# Patient Record
Sex: Male | Born: 1960 | Race: White | Hispanic: No | Marital: Married | Smoking: Never smoker
Health system: Southern US, Community
[De-identification: ages and names within clinical notes are randomized; demographics above are authoritative.]

---

## 1999-02-05 ENCOUNTER — Other Ambulatory Visit: Admission: RE | Admit: 1999-02-05 | Discharge: 1999-02-05 | Payer: Self-pay | Admitting: Otolaryngology

## 1999-08-27 ENCOUNTER — Ambulatory Visit: Admission: RE | Admit: 1999-08-27 | Discharge: 1999-08-27 | Payer: Self-pay | Admitting: Otolaryngology

## 2001-10-18 ENCOUNTER — Emergency Department (HOSPITAL_COMMUNITY): Admission: EM | Admit: 2001-10-18 | Discharge: 2001-10-18 | Payer: Self-pay

## 2001-10-18 ENCOUNTER — Encounter: Payer: Self-pay | Admitting: Emergency Medicine

## 2002-01-22 ENCOUNTER — Emergency Department (HOSPITAL_COMMUNITY): Admission: EM | Admit: 2002-01-22 | Discharge: 2002-01-22 | Payer: Self-pay

## 2002-02-12 ENCOUNTER — Encounter: Payer: Self-pay | Admitting: Gastroenterology

## 2002-02-12 ENCOUNTER — Ambulatory Visit (HOSPITAL_COMMUNITY): Admission: RE | Admit: 2002-02-12 | Discharge: 2002-02-12 | Payer: Self-pay | Admitting: Gastroenterology

## 2005-02-14 ENCOUNTER — Ambulatory Visit (HOSPITAL_COMMUNITY): Admission: RE | Admit: 2005-02-14 | Discharge: 2005-02-14 | Payer: Self-pay | Admitting: Orthopedic Surgery

## 2005-02-14 ENCOUNTER — Ambulatory Visit (HOSPITAL_BASED_OUTPATIENT_CLINIC_OR_DEPARTMENT_OTHER): Admission: RE | Admit: 2005-02-14 | Discharge: 2005-02-14 | Payer: Self-pay | Admitting: Orthopedic Surgery

## 2005-04-21 ENCOUNTER — Encounter: Admission: RE | Admit: 2005-04-21 | Discharge: 2005-06-15 | Payer: Self-pay | Admitting: Orthopedic Surgery

## 2015-09-17 ENCOUNTER — Other Ambulatory Visit: Payer: Self-pay | Admitting: Orthopedic Surgery

## 2015-09-17 DIAGNOSIS — M542 Cervicalgia: Secondary | ICD-10-CM

## 2015-09-26 ENCOUNTER — Ambulatory Visit
Admission: RE | Admit: 2015-09-26 | Discharge: 2015-09-26 | Disposition: A | Discharge status: Home / Self Care | Payer: Self-pay | Source: Ambulatory Visit | Attending: Orthopedic Surgery | Admitting: Orthopedic Surgery

## 2015-09-26 DIAGNOSIS — M542 Cervicalgia: Secondary | ICD-10-CM

## 2016-12-18 IMAGING — MR MR CERVICAL SPINE W/O CM
4 of 5 series · 27 of 48 positions shown · non-contrast
Comparison: None.

CLINICAL DATA: Initial evaluation for several month history of neck
pain radiating into right arm.

EXAM:
MRI CERVICAL SPINE WITHOUT CONTRAST
TECHNIQUE: Multiplanar, multisequence MR imaging of the cervical spine was
performed. No intravenous contrast was administered.

[Series 5: T1 · sagittal · 3.0mm · 0.66mm/px · 6 of 15 slices shown]
[im 1/15]
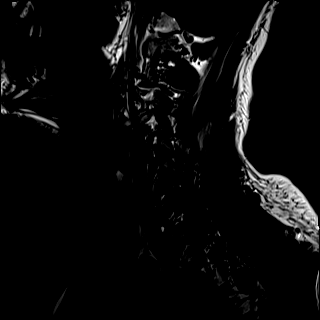
[im 3/15]
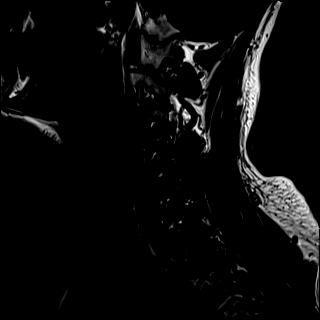
[im 6/15]
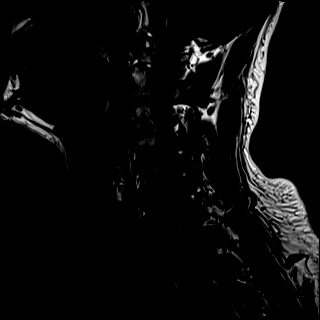
[im 9/15]
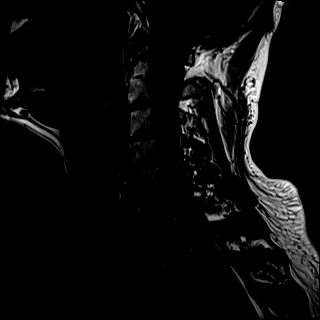
[im 12/15]
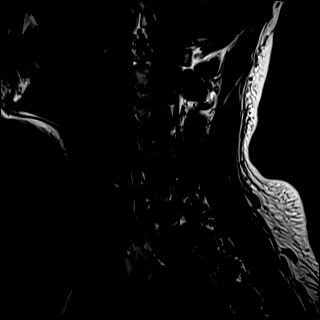
[im 15/15]
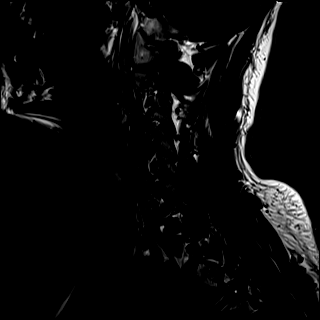

[Series 6: T2 · sagittal · 3.0mm · 0.55mm/px · 6 of 15 slices shown (1 of 2)]
[im 1/15]
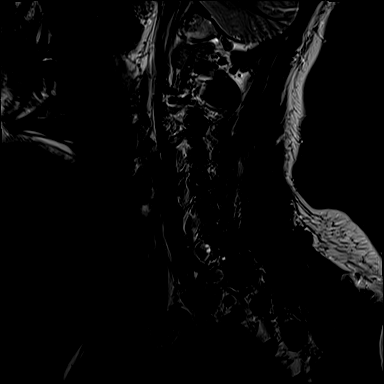
[im 3/15]
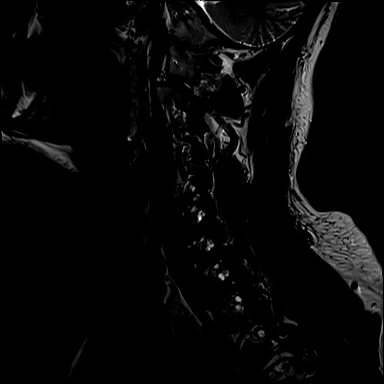
[im 6/15]
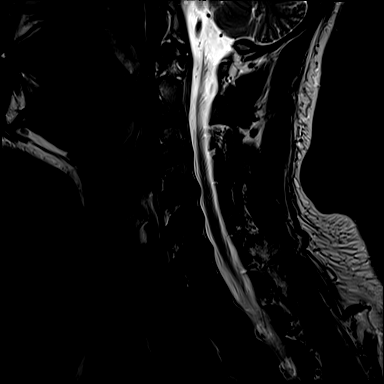
[im 9/15]
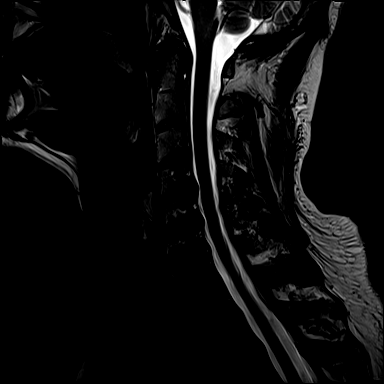
[im 12/15]
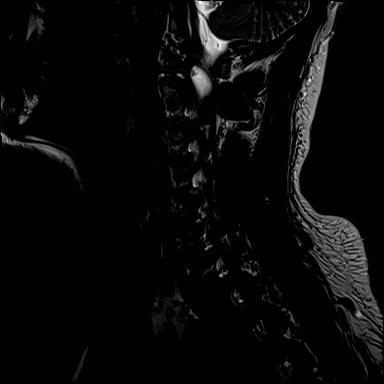
[im 15/15]
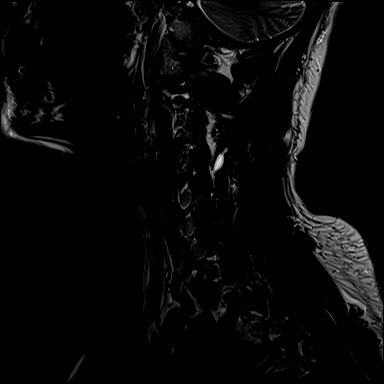

[Series 7: STIR · sagittal · 3.0mm · 0.33mm/px · 6 of 15 slices shown]
[im 1/15]
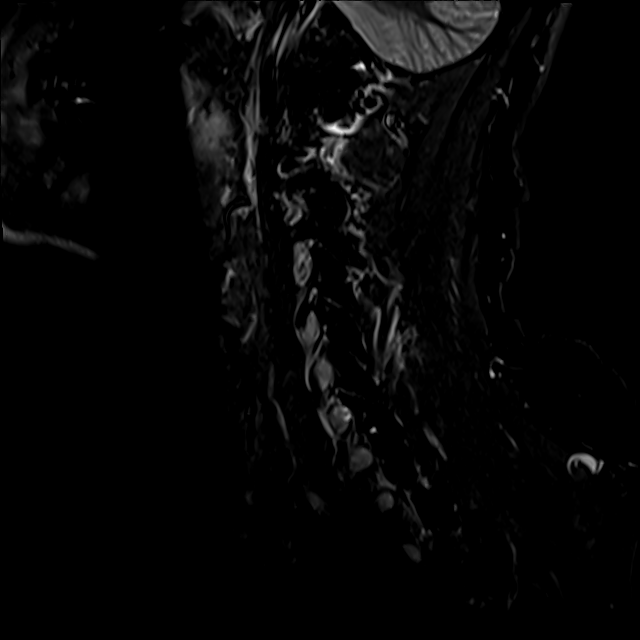
[im 3/15]
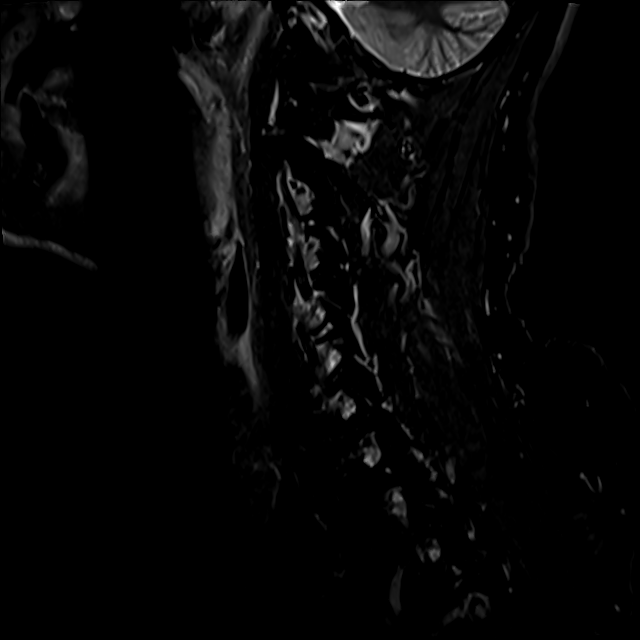
[im 6/15]
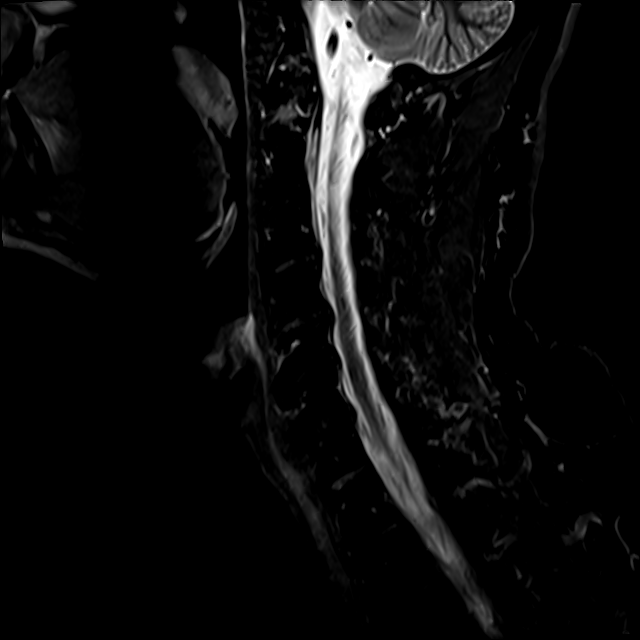
[im 9/15]
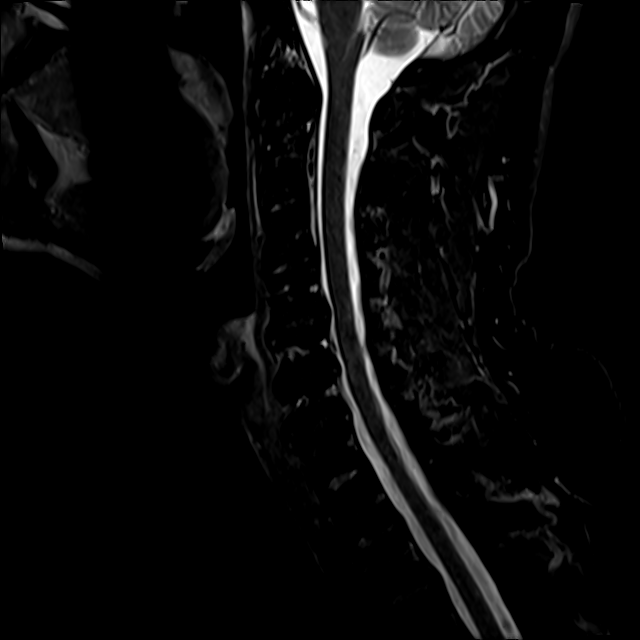
[im 12/15]
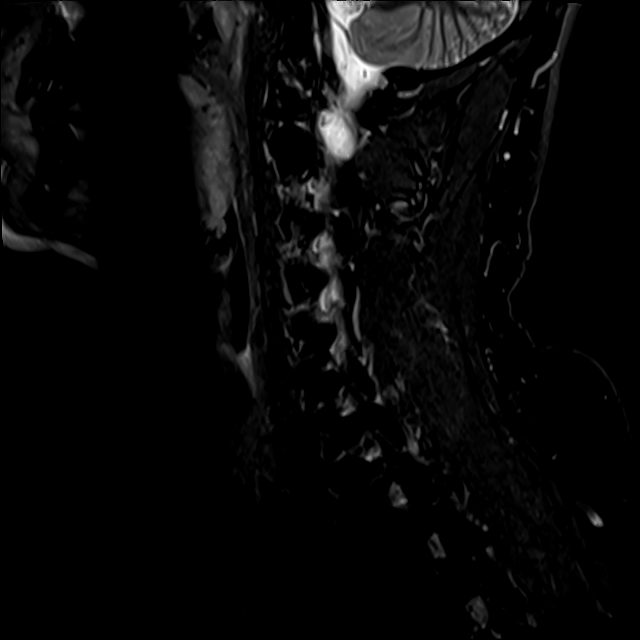
[im 15/15]
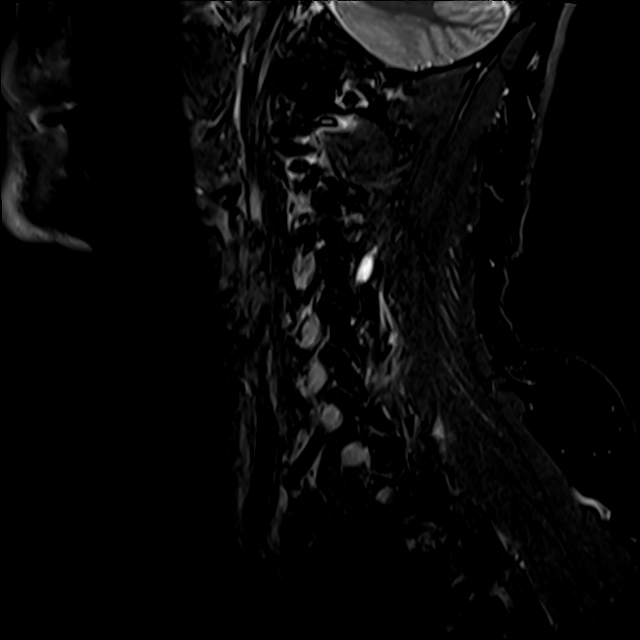

[Series 8: T2 · axial · 3.0mm · 0.50mm/px · z∈[-10,+111]mm · 9 of 38 slices shown (2 of 2)]
[im 1/38]
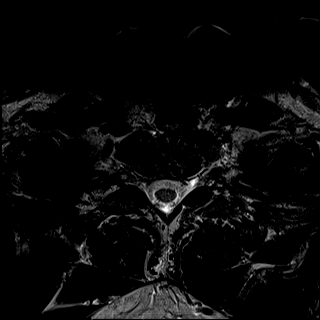
[im 6/38]
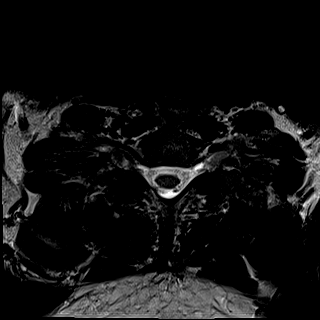
[im 11/38]
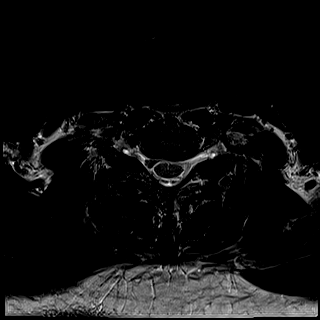
[im 16/38]
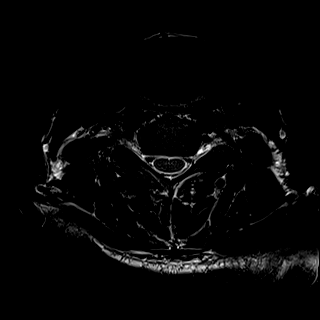
[im 19/38]
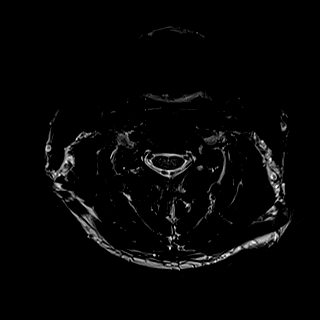
[im 22/38]
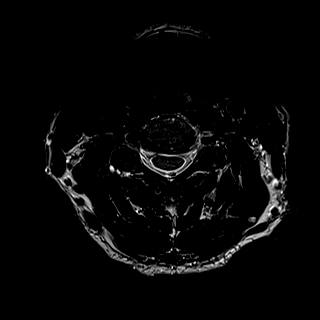
[im 27/38]
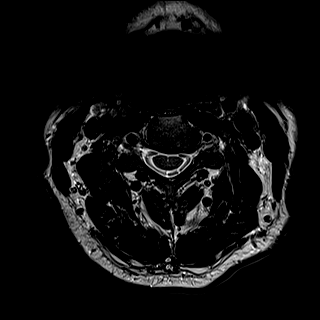
[im 32/38]
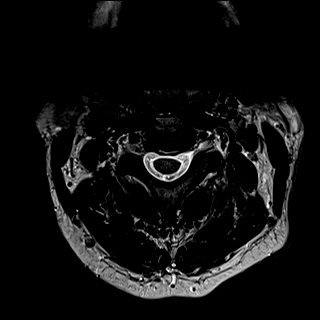
[im 38/38]
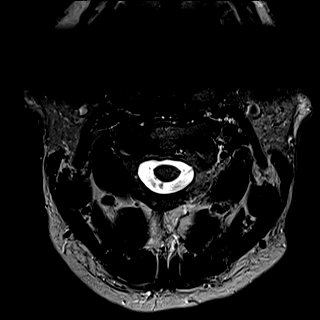

[27 of 48 positions shown; findings below may reference images not displayed]

FINDINGS: Visualized portions of the brain and posterior fossa demonstrate a
normal appearance with normal signal intensity. Craniocervical
junction widely patent.

Vertebral bodies are normally aligned with preservation of the
normal cervical lordosis. Vertebral body heights well maintained. No
listhesis. Signal intensity within the vertebral body bone marrow is
normal. No focal osseous lesion. No marrow edema.

Signal intensity within the cervical spinal cord is normal.

Paraspinous soft tissues are normal. Normal intravascular flow voids
within the vertebral arteries bilaterally.

C2-C3: Mild left-sided uncovertebral hypertrophy. Resultant minimal
if any left foraminal narrowing. Central canal and right neural
foramen widely patent.

C3-C4: Left-sided uncovertebral hypertrophy. Resultant mild to
moderate left foraminal stenosis. Right neural foramen and central
canal are widely patent.

C4-C5: Diffuse annular disc bulge with bilateral uncovertebral
spurring. Posterior disc bulging mildly effaces and flattens the
ventral thecal sac without significant canal stenosis. There is a
more focal tiny left paracentral osteophyte which indents the
ventral thecal sac as well. No significant foraminal narrowing.

C5-C6: Diffuse annular disc bulge with bilateral uncovertebral
hypertrophy. Left-sided facet arthrosis. There is mild bilateral
foraminal narrowing, slightly worse on the left. Bulging disc mildly
indents and flattens the ventral thecal sac without significant
canal stenosis.

C6-C7: Diffuse disc bulge with mild bilateral uncovertebral
hypertrophy. Mild effacement and flattening of the ventral thecal
sac without significant canal stenosis. Foramina remain widely
patent.

C7-T1:  Negative.

Mild disc bulging noted at T2-3 without significant stenosis.
Remainder the visualized upper thoracic spine are unremarkable.
IMPRESSION: 1. Mild degenerative spondylolysis at C4-5, C5-6, and C6-7 without
significant stenosis. No findings to explain right upper extremity
radicular symptoms.
2. Mild to moderate left-sided foraminal narrowing at C3-4 related
to uncovertebral hypertrophy.

## 2017-03-30 ENCOUNTER — Ambulatory Visit (INDEPENDENT_AMBULATORY_CARE_PROVIDER_SITE_OTHER): Payer: PRIVATE HEALTH INSURANCE | Admitting: Orthopedic Surgery

## 2017-03-30 ENCOUNTER — Encounter (INDEPENDENT_AMBULATORY_CARE_PROVIDER_SITE_OTHER): Payer: Self-pay | Admitting: Orthopedic Surgery

## 2017-03-30 DIAGNOSIS — G5601 Carpal tunnel syndrome, right upper limb: Secondary | ICD-10-CM

## 2017-03-30 NOTE — Progress Notes (Signed)
   Office Visit Note   Patient: Russell Patterson           Date of Birth: 08-Nov-1961           MRN: [id number] Visit Date: 03/30/2017 Requested by: No referring provider defined for this encounter. PCP: Nolene Ebbs, MD  Subjective: Chief Complaint  Patient presents with  . 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  He's had left wrist fracture fixation left carpal tunnel release done years ago.  He describes similar symptoms in the right hand.  Particularly when he goes to sleep hand will go numb immediately in this is primarily palmar symptoms.  He is taking Neurontin and Mavik 4 what sounds like neuropathy affecting his feet.  He has tried a wrist splint without relief.  It is hard for him to sleep because of the numbness and tingling              ROS: All systems reviewed are negative as they relate to the chief complaint within the history of present illness.  Patient denies  fevers or chills.   Assessment & Plan: Visit Diagnoses: No diagnosis found.  Plan: Impression is right carpal tunnel syndrome.  Plan is ultrasound-guided injection 1/3 mL Depo-Medrol 1 mL of Marcaine into the carpal tunnel.  Went just ulnar to the palmaris longus tendon.  We'll see how he does with that injection.  May need to consider nerve study and carpal tunnel release if his symptoms don't improve.  I don't think this is radicular from the neck because of the absence of any dorsal hand numbness or arm symptoms.  Follow-Up Instructions: No Follow-up on file.   Orders:  No orders of the defined types were placed in this encounter.  No orders of the defined types were placed in this encounter.     Procedures: No procedures performed   Clinical Data: No additional findings.  Objective: Vital Signs: There were no vitals taken for this visit.  Physical Exam:   Constitutional: Patient appears  well-developed HEENT:  Head: Normocephalic Eyes:EOM are normal Neck: Normal range of motion Cardiovascular: Normal rate Pulmonary/chest: Effort normal Neurologic: Patient is alert Skin: Skin is warm Psychiatric: Patient has normal mood and affect    Ortho Exam: Examination of the right hand demonstrates a little bit of flattening of the abductor pollicis brevis right versus left.  Patient has some arthritis in the PIP joints of digits 2 and 3.  Grip EPL FPL interosseous function is intact.  Wrist flexion extension strength is intact.  Negative Tinel's cubital tunnel at the elbow.  Specialty Comments:  No specialty comments available.  Imaging: No results found.   PMFS History: There are no active problems to display for this patient.  No past medical history on file.  No family history on file.  No past surgical history on file. Social History   Occupational History  . Not on file.   Social History Main Topics  . Smoking status: Never Smoker  . Smokeless tobacco: Never Used  . Alcohol use Not on file  . Drug use: Unknown  . Sexual activity: Not on file

## 2017-04-26 ENCOUNTER — Ambulatory Visit (INDEPENDENT_AMBULATORY_CARE_PROVIDER_SITE_OTHER): Payer: PRIVATE HEALTH INSURANCE | Admitting: Orthopedic Surgery

## 2017-04-26 ENCOUNTER — Ambulatory Visit (INDEPENDENT_AMBULATORY_CARE_PROVIDER_SITE_OTHER): Payer: PRIVATE HEALTH INSURANCE

## 2017-04-26 ENCOUNTER — Encounter (INDEPENDENT_AMBULATORY_CARE_PROVIDER_SITE_OTHER): Payer: Self-pay | Admitting: Orthopedic Surgery

## 2017-04-26 DIAGNOSIS — M25561 Pain in right knee: Secondary | ICD-10-CM

## 2017-04-29 DIAGNOSIS — M25561 Pain in right knee: Secondary | ICD-10-CM

## 2017-04-29 MED ORDER — BUPIVACAINE HCL 0.25 % IJ SOLN
4.0000 mL | INTRAMUSCULAR | Status: AC | PRN
  Administered 2017-04-29: 4 mL via INTRA_ARTICULAR

## 2017-04-29 MED ORDER — LIDOCAINE HCL 1 % IJ SOLN
5.0000 mL | INTRAMUSCULAR | Status: AC | PRN
  Administered 2017-04-29: 5 mL

## 2017-04-29 MED ORDER — METHYLPREDNISOLONE ACETATE 40 MG/ML IJ SUSP
40.0000 mg | INTRAMUSCULAR | Status: AC | PRN
  Administered 2017-04-29: 40 mg via INTRA_ARTICULAR

## 2017-04-29 NOTE — Progress Notes (Signed)
Office Visit Note   Patient: Russell Patterson           Date of Birth: 1961-05-19           MRN: [id number] Visit Date: 04/26/2017 Requested by: Nolene Ebbs, MD [address] [place], [place] [address] PCP: Nolene Ebbs, MD  Subjective: Chief Complaint  Patient presents with  . Right Knee - Pain    HPI: Russell Patterson is a patient with right knee pain.  Denies history of injury.  Lastly daisies notice discomfort in that his knee was feeling full.  Last night started feeling stabbing pain in the knee and was very painful to weight-bear.  States that his knee is locked up.  Lateral movement increases his pain.  Had previous injection 2 years ago 04/08/2015.  Works as a Games developer.  States his knee is stiff when he gets out of the [place].  Surface demonstrated at times.  He reports a knifelike pain in the retropatellar region of the right knee.  He does take medication Neurontin for the problem.  He did have a right wrist injection which helped him some.  His injuries and problems started in the right knee in 1996 when he was trying to demonstrate to his son skateboarding techniques.              ROS: All systems reviewed are negative as they relate to the chief complaint within the history of present illness.  Patient denies  fevers or chills.   Assessment & Plan: Visit Diagnoses:  1. Right knee pain, unspecified chronicity   2. Acute pain of right knee     Plan: Impression is right knee pain with mild effusion possible meniscal pathology with mild degenerative changes noted on radiographs.  We'll try an injection into the right knee.  If this doesn't work then really his decision will be whether or not he wants to get an MRI scan to evaluate if there is arthroscopically treatable pathology in the knee.  I'll see him back as needed  Follow-Up Instructions: Return if symptoms worsen or fail to improve.   Orders:  Orders Placed This Encounter  Procedures  . XR KNEE 3 VIEW RIGHT   No orders  of the defined types were placed in this encounter.     Procedures: Large Joint Inj Date/Time: 04/29/2017 3:08 PM Performed by: Meredith Pel Authorized by: Meredith Pel   Consent Given by:  Patient Site marked: the procedure site was marked   Timeout: prior to procedure the correct patient, procedure, and site was verified   Indications:  Pain, joint swelling and diagnostic evaluation Location:  Knee Site:  R knee Prep: patient was prepped and draped in usual sterile fashion   Needle Size:  18 G Needle Length:  1.5 inches Approach:  Superolateral Ultrasound Guidance: No   Fluoroscopic Guidance: No   Arthrogram: No   Medications:  5 mL lidocaine 1 %; 4 mL bupivacaine 0.25 %; 40 mg methylPREDNISolone acetate 40 MG/ML Aspiration Attempted: Yes   Aspirate amount (mL):  15 Aspirate:  Yellow Patient tolerance:  Patient tolerated the procedure well with no immediate complications     Clinical Data: No additional findings.  Objective: Vital Signs: There were no vitals taken for this visit.  Physical Exam:   Constitutional: Patient appears well-developed HEENT:  Head: Normocephalic Eyes:EOM are normal Neck: Normal range of motion Cardiovascular: Normal rate Pulmonary/chest: Effort normal Neurologic: Patient is alert Skin: Skin is warm Psychiatric: Patient has normal mood and  affect    Ortho Exam: Right knee exam demonstrates full active and passive range of motion mild effusion mild medial and lateral joint line tenderness with patella femoral crepitus palpable pedal pulses intact extensor mechanism no groin pain with internal/external rotation of the knee or leg palpable pedal pulses stable collateral and cruciate ligaments  Specialty Comments:  No specialty comments available.  Imaging: No results found.   PMFS History: There are no active problems to display for this patient.  No past medical history on file.  No family history on file.  No  past surgical history on file. Social History   Occupational History  . Not on file.   Social History Main Topics  . Smoking status: Never Smoker  . Smokeless tobacco: Never Used  . Alcohol use Not on file  . Drug use: Unknown  . Sexual activity: Not on file

## 2017-07-20 ENCOUNTER — Ambulatory Visit (INDEPENDENT_AMBULATORY_CARE_PROVIDER_SITE_OTHER): Payer: PRIVATE HEALTH INSURANCE | Admitting: Orthopedic Surgery

## 2017-07-20 ENCOUNTER — Encounter (INDEPENDENT_AMBULATORY_CARE_PROVIDER_SITE_OTHER): Payer: Self-pay | Admitting: Orthopedic Surgery

## 2017-07-20 DIAGNOSIS — G8929 Other chronic pain: Secondary | ICD-10-CM

## 2017-07-20 DIAGNOSIS — M75101 Unspecified rotator cuff tear or rupture of right shoulder, not specified as traumatic: Secondary | ICD-10-CM

## 2017-07-20 DIAGNOSIS — M25511 Pain in right shoulder: Secondary | ICD-10-CM | POA: Diagnosis not present

## 2017-07-20 MED ORDER — LIDOCAINE HCL 1 % IJ SOLN
5.0000 mL | INTRAMUSCULAR | Status: AC | PRN
  Administered 2017-07-20: 5 mL

## 2017-07-20 MED ORDER — METHYLPREDNISOLONE ACETATE 40 MG/ML IJ SUSP
40.0000 mg | INTRAMUSCULAR | Status: AC | PRN
  Administered 2017-07-20: 40 mg via INTRA_ARTICULAR

## 2017-07-20 MED ORDER — BUPIVACAINE HCL 0.5 % IJ SOLN
9.0000 mL | INTRAMUSCULAR | Status: AC | PRN
  Administered 2017-07-20: 9 mL via INTRA_ARTICULAR

## 2017-07-20 NOTE — Addendum Note (Signed)
Addended byLaurann Montana on: 07/20/2017 10:31 AM   Modules accepted: Orders

## 2017-07-20 NOTE — Progress Notes (Signed)
Office Visit Note   Patient: Russell Patterson           Date of Birth: 11/27/1960           MRN: [id number] Visit Date: 07/20/2017 Requested by: Nolene Ebbs, MD [address] [place], [place] [address] PCP: Nolene Ebbs, MD  Subjective: Chief Complaint  Patient presents with  . Right Shoulder - Pain  . Lower Back - Pain    HPI: Russell Patterson is a 56 year old patient who comes in with reports of right shoulder pain and bilateral buttock pain.  The right shoulder has been hurting him since the summer.  He rates that pain as 9 out of level X.  He describes doing some cranking workover the summer at which time he felt a pop in his shoulder.  He's had fairly significant pain and weakness  Since that time.  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  Patient also describes some low back pain which radiates into the buttocks.  He doesn't describe any discrete radiation into the legs.  It's moved from the right side to the left side.  He's starting to feel at some when he is walking.  Does hurt him when he does prolonged sitting more than 20 minutes.  Denies any other bowel and bladder symptoms.              ROS: All systems reviewed are negative as they relate to the chief complaint within the history of present illness.  Patient denies  fevers or chills.   Assessment & Plan: Visit Diagnoses: No diagnosis found.  Plan: impression is low back pain likely facet arthritis mediated.  He states he can live with this.  Talked with him about the workup for that which would be x-rays MRI scans and then injection.  He would much prefer to have the workup done on his shoulder.  In this regard ultrasound examination is performed today.it suggests at least upper portion of subscapularis tear.  Biceps tendon not in the groove from chronic tear 10 years ago.  Likely patient has anterior superior rotator cuff pathology.  Plan MRI arthrogram to evaluate.  See him back after that study injection  performed today in the subacromial space for pain relie  Follow-Up Instructions: No Follow-up on file.   Orders:  No orders of the defined types were placed in this encounter.  No orders of the defined types were placed in this encounter.     Procedures: Large Joint Inj Date/Time: 07/20/2017 9:56 AM Performed by: Meredith Pel Authorized by: Meredith Pel   Consent Given by:  Patient Site marked: the procedure site was marked   Timeout: prior to procedure the correct patient, procedure, and site was verified   Indications:  Pain and diagnostic evaluation Location:  Shoulder Site:  R subacromial bursa Prep: patient was prepped and draped in usual sterile fashion   Needle Size:  18 G Needle Length:  1.5 inches Approach:  Posterior Ultrasound Guidance: No   Fluoroscopic Guidance: No   Arthrogram: No   Medications:  5 mL lidocaine 1 %; 9 mL bupivacaine 0.5 %; 40 mg methylPREDNISolone acetate 40 MG/ML Aspiration Attempted: No   Patient tolerance:  Patient tolerated the procedure well with no immediate complications     Clinical Data: No additional findings.  Objective: Vital Signs: There were no vitals taken for this visit.  Physical Exam:   Constitutional: Patient appears well-developed HEENT:  Head: Normocephalic Eyes:EOM are normal Neck:  Normal range of motion Cardiovascular: Normal rate Pulmonary/chest: Effort normal Neurologic: Patient is alert Skin: Skin is warm Psychiatric: Patient has normal mood and affect    Ortho Exam: orthopedic exam demonstrates good cervical spine range of motion does have weakness with abduction and internal rotation on the right shoulder.  There is some coarseness with passive range of motion as well.  No limitation of passive range of motion.  Biceps tendon has ruptured with Popeye deformity noted.  Motor sensory function to the hand is intact.  Most and tingling and significant finding is the weakness to internal  rotation.  Specialty Comments:  No specialty comments available.  Imaging: No results found.   PMFS History: There are no active problems to display for this patient.  No past medical history on file.  No family history on file.  No past surgical history on file. Social History   Occupational History  . Not on file.   Social History Main Topics  . Smoking status: Never Smoker  . Smokeless tobacco: Never Used  . Alcohol use Not on file  . Drug use: Unknown  . Sexual activity: Not on file

## 2017-07-24 ENCOUNTER — Encounter (INDEPENDENT_AMBULATORY_CARE_PROVIDER_SITE_OTHER): Payer: Self-pay | Admitting: *Deleted

## 2018-04-26 ENCOUNTER — Ambulatory Visit (INDEPENDENT_AMBULATORY_CARE_PROVIDER_SITE_OTHER): Payer: PRIVATE HEALTH INSURANCE | Admitting: Orthopedic Surgery

## 2018-04-26 ENCOUNTER — Encounter (INDEPENDENT_AMBULATORY_CARE_PROVIDER_SITE_OTHER): Payer: Self-pay | Admitting: Orthopedic Surgery

## 2018-04-26 DIAGNOSIS — M75122 Complete rotator cuff tear or rupture of left shoulder, not specified as traumatic: Secondary | ICD-10-CM | POA: Diagnosis not present

## 2018-04-26 DIAGNOSIS — M75102 Unspecified rotator cuff tear or rupture of left shoulder, not specified as traumatic: Secondary | ICD-10-CM

## 2018-04-26 NOTE — Progress Notes (Signed)
Office Visit Note   Patient: Russell Patterson           Date of Birth: 1961/03/12           MRN: [id number] Visit Date: 04/26/2018 Requested by: Nolene Ebbs, MD [address] [place],  [address] PCP: Nolene Ebbs, MD  Subjective: Chief Complaint  Patient presents with  . Right Shoulder - Pain  . Left Shoulder - Pain    HPI: Russell Patterson is a patient with bilateral shoulder pain long-standing left worse than right.  Describes aching pain in the left arm which was for 3 or 4 hours last night.  States that the left shoulder has a toothache type pain at night but does well during the day.  Does radiate up into his neck.  Since I have last seen him he has been placed on Eliquis for atrial fibrillation.  He cannot take anti-inflammatories any  more.  He does not report much in the way of weakness in the shoulders.  No recent injury in the shoulders.              ROS: All systems reviewed are negative as they relate to the chief complaint within the history of present illness.  Patient denies  fevers or chills.   Assessment & Plan: Visit Diagnoses:  1. Nontraumatic tear of left rotator cuff, unspecified tear extent     Plan: Radicular component to the left sided pain.  MRI scan reviewed from 2016 of the cervical spine which did show some mild to moderate left-sided compressive pathology.  Looked at his shoulder under ultrasound today as well on the left-hand side and it looks like he does have rotator cuff pathology involving the supraspinatus and infraspinatus.  Subscapularis looked intact.  Plan at this time is for a diagnostic and therapeutic subacromial injection to see if that helps some of this toothache type pain he is having.  If not then I think we should consider injection of the neck after bringing him off Eliquis with consultation from his cardiologist.  He will let me know next week what he wants to do.  [name] is bilateral shoulder pain left worse than right with potential  for  Follow-Up Instructions: Return if symptoms worsen or fail to improve.   Orders:  No orders of the defined types were placed in this encounter.  No orders of the defined types were placed in this encounter.     Procedures: Large Joint Inj: L subacromial bursa on 04/26/2018 10:23 AM Indications: diagnostic evaluation and pain Details: 18 G 1.5 in needle, posterior approach  Arthrogram: No  Medications: 9 mL bupivacaine 0.5 %; 40 mg methylPREDNISolone acetate 40 MG/ML; 5 mL lidocaine 1 % Outcome: tolerated well, no immediate complications Procedure, treatment alternatives, risks and benefits explained, specific risks discussed. Consent was given by the patient. Immediately prior to procedure a time out was called to verify the correct patient, procedure, equipment, support staff and site/side marked as required. Patient was prepped and draped in the usual sterile fashion.       Clinical Data: No additional findings.  Objective: Vital Signs: There were no vitals taken for this visit.  Physical Exam:   Constitutional: Patient appears well-developed HEENT:  Head: Normocephalic Eyes:EOM are normal Neck: Normal range of motion Cardiovascular: Normal rate Pulmonary/chest: Effort normal Neurologic: Patient is alert Skin: Skin is warm Psychiatric: Patient has normal mood and affect    Ortho Exam: Orthopedic exam demonstrates good cervical spine range of motion.  He  has excellent grip EPL FPL interosseous wrist flexion extension bicep triceps and deltoid strength with no paresthesias C5-T1.  Reflexes symmetric bilateral biceps and triceps.  Rotator cuff strength testing is actually excellent infraspinatus supraspinatus and subscap muscle testing in both shoulders.  Actually a little bit stronger on the right-hand side than I remember in the past.  No coarse grinding below shoulder level with passive range of motion but once we get up to 90 degrees of abduction he does have some  coarseness in both shoulders.  This feels more like soft tissue coarseness and not arthritic type coarseness.  Specialty Comments:  No specialty comments available.  Imaging: No results found.   PMFS History: There are no active problems to display for this patient.  History reviewed. No pertinent past medical history.  History reviewed. No pertinent family history.  History reviewed. No pertinent surgical history. Social History   Occupational History  . Not on file  Tobacco Use  . Smoking status: Never Smoker  . Smokeless tobacco: Never Used  Substance and Sexual Activity  . Alcohol use: Not on file  . Drug use: Not on file  . Sexual activity: Not on file

## 2018-04-28 MED ORDER — BUPIVACAINE HCL 0.5 % IJ SOLN
9.0000 mL | INTRAMUSCULAR | Status: AC | PRN
  Administered 2018-04-26: 9 mL via INTRA_ARTICULAR

## 2018-04-28 MED ORDER — LIDOCAINE HCL 1 % IJ SOLN
5.0000 mL | INTRAMUSCULAR | Status: AC | PRN
  Administered 2018-04-26: 5 mL

## 2018-04-28 MED ORDER — METHYLPREDNISOLONE ACETATE 40 MG/ML IJ SUSP
40.0000 mg | INTRAMUSCULAR | Status: AC | PRN
  Administered 2018-04-26: 40 mg via INTRA_ARTICULAR

## 2018-06-22 ENCOUNTER — Other Ambulatory Visit (INDEPENDENT_AMBULATORY_CARE_PROVIDER_SITE_OTHER): Payer: Self-pay

## 2018-06-22 ENCOUNTER — Telehealth (INDEPENDENT_AMBULATORY_CARE_PROVIDER_SITE_OTHER): Payer: Self-pay | Admitting: Orthopedic Surgery

## 2018-06-22 DIAGNOSIS — M542 Cervicalgia: Secondary | ICD-10-CM

## 2018-06-22 NOTE — Telephone Encounter (Signed)
Patients wife called, made an appt for patients neck pain at earliest appt (07/11/18). She is requesting a call back from you when possible # [phone]

## 2018-06-22 NOTE — Telephone Encounter (Signed)
IC patients wife back. She stated patient was having continued severe neck/arm pain but worsening over the last week. Per Dr Forbes Cellar last OV note from June, patient was to consider cervical injection. Will need to get clearance from cardiologist to come off of Eliquis. I put order in for this and they will keep appt scheduled with Dr Marlou Sa for now.

## 2018-06-26 ENCOUNTER — Telehealth (INDEPENDENT_AMBULATORY_CARE_PROVIDER_SITE_OTHER): Payer: Self-pay | Admitting: *Deleted

## 2018-07-11 ENCOUNTER — Ambulatory Visit (INDEPENDENT_AMBULATORY_CARE_PROVIDER_SITE_OTHER): Payer: PRIVATE HEALTH INSURANCE | Admitting: Orthopedic Surgery

## 2018-10-08 ENCOUNTER — Ambulatory Visit (INDEPENDENT_AMBULATORY_CARE_PROVIDER_SITE_OTHER): Payer: PRIVATE HEALTH INSURANCE | Admitting: Orthopedic Surgery

## 2018-10-19 ENCOUNTER — Ambulatory Visit (INDEPENDENT_AMBULATORY_CARE_PROVIDER_SITE_OTHER): Payer: PRIVATE HEALTH INSURANCE | Admitting: Orthopedic Surgery

## 2018-11-19 ENCOUNTER — Ambulatory Visit (INDEPENDENT_AMBULATORY_CARE_PROVIDER_SITE_OTHER): Payer: PRIVATE HEALTH INSURANCE | Admitting: Orthopedic Surgery

## 2018-11-19 ENCOUNTER — Encounter (INDEPENDENT_AMBULATORY_CARE_PROVIDER_SITE_OTHER): Payer: Self-pay | Admitting: Orthopedic Surgery

## 2018-11-19 DIAGNOSIS — M180 Bilateral primary osteoarthritis of first carpometacarpal joints: Secondary | ICD-10-CM

## 2018-11-21 ENCOUNTER — Encounter (INDEPENDENT_AMBULATORY_CARE_PROVIDER_SITE_OTHER): Payer: Self-pay | Admitting: Orthopedic Surgery

## 2018-11-21 DIAGNOSIS — M180 Bilateral primary osteoarthritis of first carpometacarpal joints: Secondary | ICD-10-CM | POA: Diagnosis not present

## 2018-11-21 MED ORDER — BUPIVACAINE HCL 0.25 % IJ SOLN
0.3300 mL | INTRAMUSCULAR | Status: AC | PRN
  Administered 2018-11-21: .33 mL via INTRA_ARTICULAR

## 2018-11-21 MED ORDER — METHYLPREDNISOLONE ACETATE 40 MG/ML IJ SUSP
13.3300 mg | INTRAMUSCULAR | Status: AC | PRN
  Administered 2018-11-21: 13.33 mg via INTRA_ARTICULAR

## 2018-11-21 MED ORDER — LIDOCAINE HCL 1 % IJ SOLN
1.0000 mL | INTRAMUSCULAR | Status: AC | PRN
  Administered 2018-11-21: 1 mL

## 2018-11-21 NOTE — Progress Notes (Signed)
Office Visit Note   Patient: Russell Patterson           Date of Birth: 12/03/1960           MRN: [id number] Visit Date: 11/19/2018 Requested by: Russell Ebbs, MD [address] [place], [place] [address] PCP: Russell Ebbs, MD  Subjective: Chief Complaint  Patient presents with  . Left Thumb - Pain  . 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  Localizes the pain both to the [facility] joint as well as the IP joint of the thumbs.  He cannot take Mobic anymore because of an atrial fibrillation diagnosis.  He is on Eliquis for that.  Describes tingling numbness and burning in that thumb as well as pain with pinching.  Also reports decreased grip strength.  He has had a neck injection which helped him significantly.              ROS: All systems reviewed are negative as they relate to the chief complaint within the history of present illness.  Patient denies  fevers or chills.   Assessment & Plan: Visit Diagnoses:  1. Primary osteoarthritis of both first carpometacarpal joints     Plan: Impression is bilateral CMC arthritis with positive grind test and radiographic evidence of early mild to moderate arthritis in the [facility] joint.  He also has a component of IP thumb arthritis but that is not as severe as his CMC arthritis.  Plan is bilateral fluoroscopically guided injections today.  We will see him back as needed.  He may need to get this done once or twice a year before he elects to have [facility] arthroplasty.  Follow-Up Instructions: Return if symptoms worsen or fail to improve.   Orders:  No orders of the defined types were placed in this encounter.  No orders of the defined types were placed in this encounter.     Procedures: Small Joint Inj: bilateral thumb MCP on 11/21/2018 12:23 PM Indications: pain Details: 25 G needle, fluoroscopy-guided radial approach  Spinal Needle: No  Medications (Right): 1 mL lidocaine 1 %; 0.33  mL bupivacaine 0.25 %; 13.33 mg methylPREDNISolone acetate 40 MG/ML Medications (Left): 1 mL lidocaine 1 %; 0.33 mL bupivacaine 0.25 %; 13.33 mg methylPREDNISolone acetate 40 MG/ML Outcome: tolerated well, no immediate complications Procedure, treatment alternatives, risks and benefits explained, specific risks discussed. Consent was given by the patient. Immediately prior to procedure a time out was called to verify the correct patient, procedure, equipment, support staff and site/side marked as required. Patient was prepped and draped in the usual sterile fashion.       Clinical Data: No additional findings.  Objective: Vital Signs: There were no vitals taken for this visit.  Physical Exam:   Constitutional: Patient appears well-developed HEENT:  Head: Normocephalic Eyes:EOM are normal Neck: Normal range of motion Cardiovascular: Normal rate Pulmonary/chest: Effort normal Neurologic: Patient is alert Skin: Skin is warm Psychiatric: Patient has normal mood and affect    Ortho Exam: Ortho exam demonstrates more restricted range of motion of the IP joint thumb left versus right.  CMC grind test positive bilaterally.  Pinch strength is somewhat diminished more on the right than the left and its more painful on the right compared to the left.  Wrist range of motion otherwise intact.  No muscle atrophy in the hands.  Radial pulse palpable bilaterally.  Specialty Comments:  No specialty comments available.  Imaging: No results found.   PMFS History: There are no active problems to display for this patient.  History reviewed. No pertinent past medical history.  History reviewed. No pertinent family history.  History reviewed. No pertinent surgical history. Social History   Occupational History  . Not on file  Tobacco Use  . Smoking status: Never Smoker  . Smokeless tobacco: Never Used  Substance and Sexual Activity  . Alcohol use: Not on file  . Drug use: Not on file    . Sexual activity: Not on file

## 2019-01-08 ENCOUNTER — Telehealth (INDEPENDENT_AMBULATORY_CARE_PROVIDER_SITE_OTHER): Payer: Self-pay | Admitting: Orthopedic Surgery

## 2019-01-08 MED ORDER — MELOXICAM 15 MG PO TABS: 15.0000 mg | ORAL_TABLET | Freq: Every day | ORAL | 0 refills | Status: DC

## 2019-01-08 NOTE — Telephone Encounter (Signed)
Advised 

## 2019-01-08 NOTE — Telephone Encounter (Signed)
Patient's wife called and Needs to talk about his medication and the shot that was given to him.  [phone]

## 2019-01-08 NOTE — Telephone Encounter (Signed)
IC s/w patients wife.  She stated that patient has not gotten any relief from the thumb injections. Stated he was in agony.  She said that Dr Marlou Sa had advised could not take meloxicam due to it interferring with other medication he was taking but he talked with his cardiologist and urologist who advised that it would not. She is asking for clarification and call back from Dr Marlou Sa.

## 2019-01-08 NOTE — Telephone Encounter (Signed)
Ok for mobic 15 po q d prn - # 21 cardiologist said ok per clarice no note in chart yet-would use for 3 days and if no relief would stop

## 2019-02-07 ENCOUNTER — Other Ambulatory Visit (INDEPENDENT_AMBULATORY_CARE_PROVIDER_SITE_OTHER): Payer: Self-pay | Admitting: Orthopedic Surgery

## 2019-03-09 ENCOUNTER — Other Ambulatory Visit (INDEPENDENT_AMBULATORY_CARE_PROVIDER_SITE_OTHER): Payer: Self-pay | Admitting: Orthopedic Surgery

## 2019-03-11 NOTE — Telephone Encounter (Signed)
y

## 2019-03-11 NOTE — Telephone Encounter (Signed)
Ok to rf? 

## 2019-04-11 ENCOUNTER — Telehealth: Payer: Self-pay

## 2019-04-11 NOTE — Telephone Encounter (Signed)
Patient requests refill of meloxicam to [facility]

## 2019-04-11 NOTE — Telephone Encounter (Signed)
Ok to rf? 

## 2019-04-13 MED ORDER — MELOXICAM 15 MG PO TABS: 15.0000 mg | ORAL_TABLET | Freq: Every day | ORAL | 1 refills | Status: DC

## 2019-04-13 NOTE — Telephone Encounter (Signed)
rx submitted.  

## 2019-04-13 NOTE — Telephone Encounter (Signed)
y

## 2019-04-13 NOTE — Addendum Note (Signed)
Addended byLaurann Montana on: 04/13/2019 09:59 PM   Modules accepted: Orders

## 2019-06-12 ENCOUNTER — Encounter: Payer: Self-pay | Admitting: Orthopedic Surgery

## 2019-06-12 ENCOUNTER — Other Ambulatory Visit: Payer: Self-pay

## 2019-06-12 ENCOUNTER — Ambulatory Visit (INDEPENDENT_AMBULATORY_CARE_PROVIDER_SITE_OTHER): Payer: PRIVATE HEALTH INSURANCE | Admitting: Orthopedic Surgery

## 2019-06-12 DIAGNOSIS — M7541 Impingement syndrome of right shoulder: Secondary | ICD-10-CM | POA: Diagnosis not present

## 2019-06-12 MED ORDER — BUPIVACAINE HCL 0.5 % IJ SOLN
9.0000 mL | INTRAMUSCULAR | Status: AC | PRN
  Administered 2019-06-12: 9 mL via INTRA_ARTICULAR

## 2019-06-12 MED ORDER — LIDOCAINE HCL 1 % IJ SOLN
5.0000 mL | INTRAMUSCULAR | Status: AC | PRN
  Administered 2019-06-12: 5 mL

## 2019-06-12 MED ORDER — METHYLPREDNISOLONE ACETATE 40 MG/ML IJ SUSP
40.0000 mg | INTRAMUSCULAR | Status: AC | PRN
  Administered 2019-06-12: 22:00:00 40 mg via INTRA_ARTICULAR

## 2019-06-12 NOTE — Progress Notes (Signed)
Office Visit Note   Patient: Russell Patterson           Date of Birth: 1961/03/03           MRN: [id number] Visit Date: 06/12/2019 Requested by: Nolene Ebbs, MD [address] [place],  [place] [address] PCP: Nolene Ebbs, MD  Subjective: Chief Complaint  Patient presents with  . Right Shoulder - Pain    HPI: Russell Patterson is a patient with right shoulder pain.  Reports generally constant pain.  Flared up last week after carrying some things.  He is right-hand dominant.  Denies any radicular pain.  Has known history of carpal tunnel syndrome.  He has had shoulder pain for several years.  He is always been wanting to avoid the expense of imaging work-up such as MRI scanning.  He does do very physical work on a daily basis.  He was carrying heavy doors over the weekend and developed pain which is not really been relieved since then.  Is been taking Mobic occasionally for the problem.              ROS: All systems reviewed are negative as they relate to the chief complaint within the history of present illness.  Patient denies  fevers or chills.   Assessment & Plan: Visit Diagnoses:  1. 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  His subscapularis appears intact on ultrasound examination.  The anterior supraspinatus is not as well visualized.  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  I think it is likely that he is got rotator cuff pathology especially with some supraspinatus fossa atrophy on the right-hand side and left-hand side.  I think an injection is indicated but I recommend MRI scanning on the 58 year old patient just to really delineate exactly what the problem is and what if any solutions are available.  If he does have retracted supraspinatus tear which is possible then he may require some type of superior capsular reconstruction for pain relief.  If it is more of a  significant posterior superior tear then tendon transfer may be indicated versus observation.  He is going to come back once this injection wears off and we can proceed with MRI scanning at that time  Follow-Up Instructions: No follow-ups on file.   Orders:  No orders of the defined types were placed in this encounter.  No orders of the defined types were placed in this encounter.     Procedures: Large Joint Inj: R subacromial bursa on 06/12/2019 10:17 PM Indications: diagnostic evaluation and pain Details: 18 G 1.5 in needle, posterior approach  Arthrogram: No  Medications: 9 mL bupivacaine 0.5 %; 40 mg methylPREDNISolone acetate 40 MG/ML; 5 mL lidocaine 1 % Outcome: tolerated well, no immediate complications Procedure, treatment alternatives, risks and benefits explained, specific risks discussed. Consent was given by the patient. Immediately prior to procedure a time out was called to verify the correct patient, procedure, equipment, support staff and site/side marked as required. Patient was prepped and draped in the usual sterile fashion.       Clinical Data: No additional findings.  Objective: Vital Signs: There were no vitals taken for this visit.  Physical Exam:   Constitutional: Patient appears well-developed HEENT:  Head: Normocephalic Eyes:EOM are normal Neck: Normal range of motion Cardiovascular: Normal rate Pulmonary/chest: Effort normal Neurologic: Patient is alert Skin: Skin is warm Psychiatric: Patient  has normal mood and affect    Ortho Exam: Ortho exam demonstrates full active and passive range of motion of both shoulders.  I thought on examination of the right that his subscap strength was a little bit less compared to the left.  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  Little  bit of grinding on both the right and left hand side but not as much as I would expect for a large tear.  Specialty Comments:  No specialty comments available.  Imaging: No results found.   PMFS History: There are no active problems to display for this patient.  History reviewed. No pertinent past medical history.  History reviewed. No pertinent family history.  History reviewed. No pertinent surgical history. Social History   Occupational History  . Not on file  Tobacco Use  . Smoking status: Never Smoker  . Smokeless tobacco: Never Used  Substance and Sexual Activity  . Alcohol use: Not on file  . Drug use: Not on file  . Sexual activity: Not on file

## 2019-07-02 ENCOUNTER — Other Ambulatory Visit: Payer: Self-pay | Admitting: Orthopedic Surgery

## 2019-07-31 ENCOUNTER — Other Ambulatory Visit: Payer: Self-pay | Admitting: Orthopedic Surgery

## 2019-09-09 ENCOUNTER — Other Ambulatory Visit: Payer: Self-pay | Admitting: Orthopedic Surgery

## 2019-09-10 NOTE — Telephone Encounter (Signed)
Please advise. Thanks.  

## 2019-10-16 ENCOUNTER — Other Ambulatory Visit: Payer: Self-pay | Admitting: Surgical

## 2019-10-31 ENCOUNTER — Ambulatory Visit (INDEPENDENT_AMBULATORY_CARE_PROVIDER_SITE_OTHER): Payer: PRIVATE HEALTH INSURANCE | Admitting: Orthopedic Surgery

## 2019-10-31 ENCOUNTER — Encounter: Payer: Self-pay | Admitting: Orthopedic Surgery

## 2019-10-31 ENCOUNTER — Other Ambulatory Visit: Payer: Self-pay

## 2019-10-31 DIAGNOSIS — S46112A Strain of muscle, fascia and tendon of long head of biceps, left arm, initial encounter: Secondary | ICD-10-CM | POA: Diagnosis not present

## 2019-10-31 DIAGNOSIS — M7541 Impingement syndrome of right shoulder: Secondary | ICD-10-CM

## 2019-11-01 ENCOUNTER — Encounter: Payer: Self-pay | Admitting: Orthopedic Surgery

## 2019-11-01 DIAGNOSIS — S46112A Strain of muscle, fascia and tendon of long head of biceps, left arm, initial encounter: Secondary | ICD-10-CM

## 2019-11-01 MED ORDER — LIDOCAINE HCL 1 % IJ SOLN
5.0000 mL | INTRAMUSCULAR | Status: AC | PRN
  Administered 2019-11-01: 22:00:00 5 mL

## 2019-11-01 MED ORDER — METHYLPREDNISOLONE ACETATE 40 MG/ML IJ SUSP
40.0000 mg | INTRAMUSCULAR | Status: AC | PRN
  Administered 2019-11-01: 40 mg via INTRA_ARTICULAR

## 2019-11-01 MED ORDER — BUPIVACAINE HCL 0.5 % IJ SOLN
9.0000 mL | INTRAMUSCULAR | Status: AC | PRN
  Administered 2019-11-01: 9 mL via INTRA_ARTICULAR

## 2019-11-01 NOTE — Progress Notes (Signed)
Office Visit Note   Patient: Russell Patterson           Date of Birth: 1961-03-02           MRN: [id number] Visit Date: 10/31/2019 Requested by: Russell Ebbs, MD [address] [place],  [place] [address] PCP: Russell Ebbs, MD  Subjective: Chief Complaint  Patient presents with  . Right Shoulder - Pain  . Left Shoulder - Pain    HPI: [name] is a patient with bilateral shoulder pain right worse than left.  Has known history of likely rotator cuff pathology on the right-hand side.  He states the right shoulder is worse than it has ever been.  He states that the pain causes achiness every night.  He also describes 3 weeks ago injuring the left shoulder pushing a box of tile.  Developed a Popeye deformity with proximal biceps rupture similar to what he has on the right-hand side.  He is on Eliquis for atrial fibrillation.              ROS: All systems reviewed are negative as they relate to the chief complaint within the history of present illness.  Patient denies  fevers or chills.   Assessment & Plan: Visit Diagnoses:  1. Impingement syndrome of right shoulder   2. Labral tear of long head of left biceps tendon, initial encounter     Plan: Impression is right shoulder pain with rotator cuff pathology and some degree of coarse grinding and crepitus.  He wants to try an injection in that right shoulder in January.  On the left-hand side he has new biceps rupture proximally.  Looked at that left shoulder with ultrasound and his subscapularis is intact both clinically and on ultrasound.  We will try a subacromial injection in that left shoulder to see if that helps.  See him back in January for evaluation of the right shoulder.  I think he does have some rotator cuff pathology in the left hand side supraspinatus region which is not quite as bad as the right-hand side.  Follow-Up Instructions: No follow-ups on file.   Orders:  No orders of the defined types were placed in this  encounter.  No orders of the defined types were placed in this encounter.     Procedures: Large Joint Inj: L subacromial bursa on 11/01/2019 9:40 PM Indications: diagnostic evaluation and pain Details: 18 G 1.5 in needle, posterior approach  Arthrogram: No  Medications: 9 mL bupivacaine 0.5 %; 40 mg methylPREDNISolone acetate 40 MG/ML; 5 mL lidocaine 1 % Outcome: tolerated well, no immediate complications Procedure, treatment alternatives, risks and benefits explained, specific risks discussed. Consent was given by the patient. Immediately prior to procedure a time out was called to verify the correct patient, procedure, equipment, support staff and site/side marked as required. Patient was prepped and draped in the usual sterile fashion.       Clinical Data: No additional findings.  Objective: Vital Signs: There were no vitals taken for this visit.  Physical Exam:   Constitutional: Patient appears well-developed HEENT:  Head: Normocephalic Eyes:EOM are normal Neck: Normal range of motion Cardiovascular: Normal rate Pulmonary/chest: Effort normal Neurologic: Patient is alert Skin: Skin is warm Psychiatric: Patient has normal mood and affect    Ortho Exam: Ortho exam demonstrates pretty reasonable range of motion bilaterally with greater than 90 degrees of forward flexion abduction.  Rotator cuff strength is good to subscap testing bilaterally.  Little bit weaker and external rotation on the  right compared to the left.  Little bit more coarseness on the right with internal/external rotation compared to the left.  No discrete AC joint tenderness is present.  Popeye deformity present bilaterally but there is no swelling or bruising or ecchymosis on the left-hand side.  Specialty Comments:  No specialty comments available.  Imaging: No results found.   PMFS History: There are no problems to display for this patient.  History reviewed. No pertinent past medical  history.  History reviewed. No pertinent family history.  History reviewed. No pertinent surgical history. Social History   Occupational History  . Not on file  Tobacco Use  . Smoking status: Never Smoker  . Smokeless tobacco: Never Used  Substance and Sexual Activity  . Alcohol use: Not on file  . Drug use: Not on file  . Sexual activity: Not on file

## 2019-11-18 ENCOUNTER — Other Ambulatory Visit: Payer: Self-pay | Admitting: Surgical

## 2019-11-18 NOTE — Telephone Encounter (Signed)
Ok to rf? 

## 2019-12-23 ENCOUNTER — Other Ambulatory Visit: Payer: Self-pay | Admitting: Surgical

## 2020-01-24 ENCOUNTER — Other Ambulatory Visit: Payer: Self-pay | Admitting: Surgical

## 2020-03-02 ENCOUNTER — Other Ambulatory Visit: Payer: Self-pay | Admitting: Orthopedic Surgery

## 2020-03-02 NOTE — Telephone Encounter (Signed)
Pls advise. Thanks.  

## 2020-03-04 NOTE — Telephone Encounter (Signed)
Pls advise. Thanks.  

## 2020-04-02 ENCOUNTER — Other Ambulatory Visit: Payer: Self-pay | Admitting: Surgical

## 2020-04-02 NOTE — Telephone Encounter (Signed)
Pls advise. Thanks.  

## 2020-04-02 NOTE — Telephone Encounter (Signed)
This is a GD pt 

## 2020-06-01 ENCOUNTER — Other Ambulatory Visit (INDEPENDENT_AMBULATORY_CARE_PROVIDER_SITE_OTHER): Payer: Self-pay | Admitting: Orthopedic Surgery

## 2020-06-02 NOTE — Telephone Encounter (Signed)
Pls advise.  

## 2020-06-12 ENCOUNTER — Other Ambulatory Visit: Payer: Self-pay

## 2020-06-12 MED ORDER — MELOXICAM 15 MG PO TABS: 15.0000 mg | ORAL_TABLET | Freq: Every day | ORAL | 0 refills | Status: DC

## 2020-06-12 NOTE — Progress Notes (Signed)
Sent rxrf for mobic to patients pharmacy.

## 2020-07-07 ENCOUNTER — Telehealth: Payer: Self-pay | Admitting: Orthopedic Surgery

## 2020-07-07 DIAGNOSIS — S46112A Strain of muscle, fascia and tendon of long head of biceps, left arm, initial encounter: Secondary | ICD-10-CM

## 2020-07-07 DIAGNOSIS — M7541 Impingement syndrome of right shoulder: Secondary | ICD-10-CM

## 2020-07-07 NOTE — Telephone Encounter (Signed)
Sending to you as FYI Put referral in for this patient.

## 2020-07-07 NOTE — Telephone Encounter (Signed)
Pt would like a referral for Dr.Anakwenze @ [facility] that is a shoulder specialist and the pt states if Lauren or Dr.Dean has any questions they can give them a call @   [phone]

## 2020-07-09 NOTE — Telephone Encounter (Signed)
yep.  Got that one. thx

## 2020-07-10 ENCOUNTER — Telehealth: Payer: Self-pay | Admitting: Orthopedic Surgery

## 2020-07-20 ENCOUNTER — Other Ambulatory Visit: Payer: Self-pay | Admitting: Orthopedic Surgery

## 2020-07-21 NOTE — Telephone Encounter (Signed)
E 

## 2020-07-21 NOTE — Telephone Encounter (Signed)
Pls advise.  

## 2020-07-24 ENCOUNTER — Telehealth: Payer: Self-pay | Admitting: Orthopedic Surgery

## 2020-07-24 NOTE — Telephone Encounter (Signed)
Can you please call patients wife and advise.  I see in referral where you have refaxed and spoken with patient already.

## 2020-07-24 NOTE — Telephone Encounter (Signed)
Pts wife called stating she asked we put in a referral for Dr.Anakwanze @ [facility] but when she called [facility] they told her that they didn't have the referral. I did notify her that epic showed one pending but she would like a CB to discuss what is going on  [phone]

## 2020-07-27 NOTE — Telephone Encounter (Signed)
I called and discussed, see referral notes.

## 2020-08-21 ENCOUNTER — Other Ambulatory Visit: Payer: Self-pay | Admitting: Surgical

## 2020-08-24 NOTE — Telephone Encounter (Signed)
Please advise 

## 2020-09-28 ENCOUNTER — Other Ambulatory Visit: Payer: Self-pay | Admitting: Surgical

## 2020-09-28 NOTE — Telephone Encounter (Signed)
Please advise. Thanks.  

## 2020-09-28 NOTE — Telephone Encounter (Signed)
Needs updated labs if he wants to continue taking Mobic (CMP or BMP).  Last labs evaluated renal function was in 2019

## 2020-10-22 ENCOUNTER — Other Ambulatory Visit: Payer: Self-pay | Admitting: Surgical

## 2020-11-22 ENCOUNTER — Other Ambulatory Visit: Payer: Self-pay | Admitting: Orthopedic Surgery

## 2020-11-23 NOTE — Telephone Encounter (Signed)
Please advise 

## 2020-12-02 NOTE — Telephone Encounter (Signed)
Ok to rf pls claal htx

## 2020-12-31 ENCOUNTER — Other Ambulatory Visit: Payer: Self-pay | Admitting: Surgical

## 2021-02-01 ENCOUNTER — Other Ambulatory Visit: Payer: Self-pay | Admitting: Orthopedic Surgery

## 2021-02-01 NOTE — Telephone Encounter (Signed)
Please advise. Thanks.  

## 2021-03-05 ENCOUNTER — Other Ambulatory Visit (HOSPITAL_COMMUNITY): Payer: Self-pay | Admitting: Otolaryngology

## 2021-03-05 DIAGNOSIS — D37030 Neoplasm of uncertain behavior of the parotid salivary glands: Secondary | ICD-10-CM

## 2021-03-09 ENCOUNTER — Encounter (HOSPITAL_COMMUNITY): Payer: Self-pay

## 2021-03-09 NOTE — Progress Notes (Unsigned)
Russell Patterson, 60 y.o., Oct 18, 1961  MRN:  [id number] Phone:  [phone] (H)       PCP:  Nolene Ebbs, MD Coverage:  Generic Commercial/Generic Commercial  Next Appt With Radiology (MC-US 2) 03/23/2021 at 1:00 PM           RE: Biopsy Received: Today  Message Details  Arne Cleveland, MD  Ernestene Mention   [place] FNA R parotid nodule  Seen only on coronals from [facility]   DDH    Previous Messages  ----- Message -----  From: Lenore Cordia  Sent: 03/09/2021 11:08 AM EDT  To: Arne Cleveland, MD, [facility] Procedure Requests  Subject: RE: Biopsy                    Office stated they had the films pushed the ct angio done at [facility].  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

## 2021-03-22 ENCOUNTER — Other Ambulatory Visit: Payer: Self-pay | Admitting: Student

## 2021-03-23 ENCOUNTER — Encounter (HOSPITAL_COMMUNITY): Payer: Self-pay

## 2021-03-23 ENCOUNTER — Ambulatory Visit (HOSPITAL_COMMUNITY): Admission: RE | Admit: 2021-03-23 | Payer: BLUE CROSS/BLUE SHIELD | Source: Ambulatory Visit

## 2021-04-13 ENCOUNTER — Other Ambulatory Visit: Payer: Self-pay | Admitting: Otolaryngology

## 2021-04-13 DIAGNOSIS — D110 Benign neoplasm of parotid gland: Secondary | ICD-10-CM

## 2021-06-17 ENCOUNTER — Other Ambulatory Visit: Payer: Self-pay | Admitting: Surgical

## 2021-06-17 DIAGNOSIS — S46112A Strain of muscle, fascia and tendon of long head of biceps, left arm, initial encounter: Secondary | ICD-10-CM

## 2021-06-17 NOTE — Telephone Encounter (Signed)
Last filled 02/01/2021 Qty: 30 tablets Refills: 2 Patient is taking one tablet by mouth daily.

## 2022-05-11 ENCOUNTER — Other Ambulatory Visit: Payer: Self-pay | Admitting: Internal Medicine

## 2022-05-12 LAB — COMPLETE METABOLIC PANEL WITH GFR
AG Ratio: 1.8 (calc) (ref 1.0–2.5)
ALT: 15 U/L (ref 9–46)
AST: 14 U/L (ref 10–35)
Albumin: 4.3 g/dL (ref 3.6–5.1)
Alkaline phosphatase (APISO): 59 U/L (ref 35–144)
BUN: 18 mg/dL (ref 7–25)
CO2: 23 mmol/L (ref 20–32)
Calcium: 9.2 mg/dL (ref 8.6–10.3)
Chloride: 103 mmol/L (ref 98–110)
Creat: 1.08 mg/dL (ref 0.70–1.35)
Globulin: 2.4 g/dL (calc) (ref 1.9–3.7)
Glucose, Bld: 79 mg/dL (ref 65–99)
Potassium: 4.6 mmol/L (ref 3.5–5.3)
Sodium: 137 mmol/L (ref 135–146)
Total Bilirubin: 0.7 mg/dL (ref 0.2–1.2)
Total Protein: 6.7 g/dL (ref 6.1–8.1)
eGFR: 78 mL/min/{1.73_m2} (ref >=60)

## 2022-05-12 LAB — CBC
HCT: 50 % (ref 38.5–50.0)
Hemoglobin: 16.8 g/dL (ref 13.2–17.1)
MCH: 30.1 pg (ref 27.0–33.0)
MCHC: 33.6 g/dL (ref 32.0–36.0)
MCV: 89.4 fL (ref 80.0–100.0)
MPV: 9.4 fL (ref 7.5–12.5)
Platelets: 241 10*3/uL (ref 140–400)
RBC: 5.59 10*6/uL (ref 4.20–5.80)
RDW: 13.7 % (ref 11.0–15.0)
WBC: 7.5 10*3/uL (ref 3.8–10.8)

## 2022-05-12 LAB — LIPID PANEL
Cholesterol: 246 mg/dL — ABNORMAL HIGH (ref <200)
HDL: 71 mg/dL (ref >=40)
LDL Cholesterol (Calc): 160 mg/dL (calc) — ABNORMAL HIGH
Non-HDL Cholesterol (Calc): 175 mg/dL (calc) — ABNORMAL HIGH (ref <130)
Total CHOL/HDL Ratio: 3.5 (calc) (ref <5.0)
Triglycerides: 60 mg/dL (ref <150)

## 2022-05-12 LAB — TSH: TSH: 1.11 mIU/L (ref 0.40–4.50)

## 2022-05-12 LAB — VITAMIN D 25 HYDROXY (VIT D DEFICIENCY, FRACTURES): Vit D, 25-Hydroxy: 44 ng/mL (ref 30–100)
